# Patient Record
Sex: Male | Born: 2000 | Hispanic: Yes | Marital: Single | Smoking: Never smoker
Health system: Southern US, Community
[De-identification: ages and names within clinical notes are randomized; demographics above are authoritative.]

## PROBLEM LIST (undated history)

## (undated) HISTORY — PX: NECK SURGERY: SHX720

## (undated) HISTORY — PX: FOOT SURGERY: SHX648

---
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
## 2022-04-26 ENCOUNTER — Other Ambulatory Visit: Payer: Self-pay

## 2022-04-26 ENCOUNTER — Emergency Department (HOSPITAL_COMMUNITY)
Admission: EM | Admit: 2022-04-26 | Discharge: 2022-04-26 | Disposition: A | Discharge status: Home / Self Care | Payer: Self-pay | Attending: Emergency Medicine | Admitting: Emergency Medicine

## 2022-04-26 ENCOUNTER — Encounter (HOSPITAL_COMMUNITY): Payer: Self-pay | Admitting: Emergency Medicine

## 2022-04-26 DIAGNOSIS — M791 Myalgia, unspecified site: Secondary | ICD-10-CM | POA: Insufficient documentation

## 2022-04-26 DIAGNOSIS — J069 Acute upper respiratory infection, unspecified: Secondary | ICD-10-CM | POA: Insufficient documentation

## 2022-04-26 DIAGNOSIS — Z1152 Encounter for screening for COVID-19: Secondary | ICD-10-CM | POA: Insufficient documentation

## 2022-04-26 LAB — RESP PANEL BY RT-PCR (FLU A&B, COVID) ARPGX2
Influenza A by PCR: NEGATIVE
Influenza B by PCR: NEGATIVE
SARS Coronavirus 2 by RT PCR: NEGATIVE

## 2022-04-26 NOTE — ED Provider Notes (Signed)
[facility] EMERGENCY DEPARTMENT Provider Note   CSN: [id number] Arrival date & time: 04/26/22  1219     History  Chief Complaint  Patient presents with   Generalized Body Aches    Peter Quinn is a 21 y.o. male.  Patient complains of generalized body aches, fevers, chills, runny nose for the past week.  Patient states that over the past 2 weeks there have been people at work who have gone home with some unknown viral illness.  He states no one is told him it was COVID or the flu.  He denies cough, abdominal pain, nausea, vomiting at this time.  No relevant past medical history on file  HPI     Home Medications Prior to Admission medications   Not on File      Allergies    Patient has no known allergies.    Review of Systems   Review of Systems  Constitutional:  Positive for chills and fever.  HENT:  Positive for rhinorrhea.   Respiratory:  Negative for cough and shortness of breath.   Gastrointestinal:  Negative for abdominal pain, nausea and vomiting.  Musculoskeletal:  Positive for myalgias.    Physical Exam Updated Vital Signs BP (!) 138/92 (BP Location: Right Arm)   Pulse (!) 115   Temp 98.9 F (37.2 C) (Oral)   Resp 20   Ht 5\' 8"  (1.727 m)   Wt 88.9 kg   SpO2 99%   BMI 29.80 kg/m  Physical Exam Vitals and nursing note reviewed.  HENT:     Head: Normocephalic and atraumatic.     Nose: Rhinorrhea present.  Eyes:     Conjunctiva/sclera: Conjunctivae normal.  Cardiovascular:     Rate and Rhythm: Normal rate and regular rhythm.  Pulmonary:     Effort: Pulmonary effort is normal. No respiratory distress.     Breath sounds: Normal breath sounds.  Abdominal:     Palpations: Abdomen is soft.     Tenderness: There is no abdominal tenderness.  Musculoskeletal:        General: No signs of injury.     Cervical back: Normal range of motion.  Skin:    General: Skin is warm and dry.  Neurological:     Mental Status: He is alert.  Psychiatric:         Speech: Speech normal.        Behavior: Behavior normal.     ED Results / Procedures / Treatments   Labs (all labs ordered are listed, but only abnormal results are displayed) Labs Reviewed  RESP PANEL BY RT-PCR (FLU A&B, COVID) [id number]    EKG None  Radiology No results found.  Procedures Procedures    Medications Ordered in ED Medications - No data to display  ED Course/ Medical Decision Making/ A&P                           Medical Decision Making  Patient presents the emergency room complaining of chills, fever, runny nose.  Differential diagnosis includes but is not limited to COVID-19, influenza, other viral illness, and others  There is no relevant documentation available for med review  I ordered and reviewed labs.  Pertinent results include negative COVID-19 and influenza testing.  There is no indication at this time for imaging.  The patient's lungs are clear to auscultation bilaterally  The patient's symptoms are consistent with a viral upper respiratory infection.  Testing was negative for  COVID-19 and influenza today.  There is no indication at this time for further work-up.  The patient does not appear to be toxic.  Plan to discharge patient home.  Patient may use over-the-counter medications for symptom relief as needed.        Final Clinical Impression(s) / ED Diagnoses Final diagnoses:  Upper respiratory tract infection, unspecified type    Rx / DC Orders ED Discharge Orders     None         Pamala Duffel 04/26/22 1400    Terald Sleeper, MD 04/26/22 1655

## 2022-04-26 NOTE — ED Triage Notes (Signed)
Pt via POV c/o generalized body aches, fevers/chills, runny nose, itching for a week. Sick contacts at work within the past 2 weeks.

## 2022-04-26 NOTE — Discharge Instructions (Addendum)
Seen today due to an upper respiratory infection.  Your testing was negative for COVID-19 and influenza.  This is likely some other virus.  Please take over-the-counter medications as needed for symptom relief such as DayQuil or NyQuil.  You may return to work once you are fever free for 24 hours.

## 2022-04-26 NOTE — ED Notes (Signed)
Pa eval prior to nursing assessment

## 2024-04-22 ENCOUNTER — Ambulatory Visit
Admission: EM | Admit: 2024-04-22 | Discharge: 2024-04-22 | Disposition: A | Discharge status: Home / Self Care | Payer: Self-pay | Attending: Family Medicine | Admitting: Family Medicine

## 2024-04-22 DIAGNOSIS — B350 Tinea barbae and tinea capitis: Secondary | ICD-10-CM

## 2024-04-22 DIAGNOSIS — L0100 Impetigo, unspecified: Secondary | ICD-10-CM

## 2024-04-22 MED ORDER — CHLORHEXIDINE GLUCONATE 4 % EX SOLN: Freq: Every day | CUTANEOUS | 0 refills | Status: AC | PRN

## 2024-04-22 MED ORDER — KETOCONAZOLE 2 % EX SHAM: 1.0000 | MEDICATED_SHAMPOO | CUTANEOUS | 0 refills | Status: AC

## 2024-04-22 MED ORDER — KETOCONAZOLE 2 % EX CREA: 1.0000 | TOPICAL_CREAM | Freq: Two times a day (BID) | CUTANEOUS | 0 refills | Status: AC | PRN

## 2024-04-22 NOTE — ED Triage Notes (Signed)
 Pt reports rash on the right side of the head, itchy and painful, started x 2 weeks ago. Have now scabbed over

## 2024-04-22 NOTE — Discharge Instructions (Signed)
 I have sent in some antifungal shampoo and cream to help treat what I suspect to be a scalp fungal infection.  The areas that have been scratched and are open to appear to be becoming irritated and possibly coming infected so I have sent over a strong antiseptic wash to use twice daily as well.  Follow-up for worsening or unresolving symptoms.

## 2024-04-22 NOTE — ED Provider Notes (Signed)
 [facility]    CSN: [id number] Arrival date & time: 04/22/24  1811      History   Chief Complaint No chief complaint on file.   HPI Peter Quinn is a 23 y.o. male.   Patient presenting today with a rash for the past few weeks to the right side of head that is itchy and has now scabbed over.  Denies fever, chills, new soaps or products used, throat itching or swelling, chest tightness.  So far not trying anything over-the-counter for symptoms.    History reviewed. No pertinent past medical history.  Patient Active Problem List   Diagnosis Date Noted   FOREIGN BODY, FOOT 09/21/2009    Past Surgical History:  Procedure Laterality Date   FOOT SURGERY     had glass removed   NECK SURGERY     ?abcess       Home Medications    Prior to Admission medications   Medication Sig Start Date End Date Taking? Authorizing Provider  chlorhexidine (HIBICLENS) 4 % external liquid Apply topically daily as needed. 04/22/24  Yes Stuart Vernell Norris, PA-C  ketoconazole (NIZORAL) 2 % cream Apply 1 Application topically 2 (two) times daily as needed for irritation. 04/22/24  Yes Stuart Vernell Norris, PA-C  ketoconazole (NIZORAL) 2 % shampoo Apply 1 Application topically 2 (two) times a week. 04/23/24  Yes Stuart Vernell Norris, PA-C    Family History History reviewed. No pertinent family history.  Social History Social History   Tobacco Use   Smoking status: Never  Vaping Use   Vaping status: Never Used  Substance Use Topics   Alcohol use: No     Allergies   Patient has no known allergies.   Review of Systems Review of Systems Per HPI  Physical Exam Triage Vital Signs ED Triage Vitals  Encounter Vitals Group     BP 04/22/24 1900 135/88     Girls Systolic BP Percentile --      Girls Diastolic BP Percentile --      Boys Systolic BP Percentile --      Boys Diastolic BP Percentile --      Pulse Rate 04/22/24 1900 74     Resp 04/22/24 1900 18      Temp 04/22/24 1900 98.6 F (37 C)     Temp Source 04/22/24 1900 Oral     SpO2 04/22/24 1900 97 %     Weight --      Height --      Head Circumference --      Peak Flow --      Pain Score 04/22/24 1902 3     Pain Loc --      Pain Education --      Exclude from Growth Chart --    No data found.  Updated Vital Signs BP 135/88 (BP Location: Right Arm)   Pulse 74   Temp 98.6 F (37 C) (Oral)   Resp 18   SpO2 97%   Visual Acuity Right Eye Distance:   Left Eye Distance:   Bilateral Distance:    Right Eye Near:   Left Eye Near:    Bilateral Near:     Physical Exam Vitals and nursing note reviewed.  Constitutional:      Appearance: Normal appearance.  HENT:     Head: Atraumatic.  Eyes:     Extraocular Movements: Extraocular movements intact.     Conjunctiva/sclera: Conjunctivae normal.  Cardiovascular:     Rate and  Rhythm: Normal rate.  Pulmonary:     Effort: Pulmonary effort is normal.  Musculoskeletal:        General: Normal range of motion.     Cervical back: Normal range of motion and neck supple.  Skin:    General: Skin is warm and dry.     Findings: Rash present.     Comments: Erythematous papular lesions, some scabbed and crusted, some dry and flaky to the right side of scalp and neck  Neurological:     Mental Status: He is oriented to person, place, and time.  Psychiatric:        Mood and Affect: Mood normal.        Thought Content: Thought content normal.        Judgment: Judgment normal.      UC Treatments / Results  Labs (all labs ordered are listed, but only abnormal results are displayed) Labs Reviewed - No data to display  EKG   Radiology No results found.  Procedures Procedures (including critical care time)  Medications Ordered in UC Medications - No data to display  Initial Impression / Assessment and Plan / UC Course  I have reviewed the triage vital signs and the nursing notes.  Pertinent labs & imaging results that were  available during my care of the patient were reviewed by me and considered in my medical decision making (see chart for details).     Suspect tinea capitis, treat with ketoconazole cream and shampoo.  Crusted areas do appear to be developing some impetigo still will treat with Hibiclens solution several times daily for this and follow-up for worsening or unresolving symptoms.  Final Clinical Impressions(s) / UC Diagnoses   Final diagnoses:  Tinea capitis  Impetigo     Discharge Instructions      I have sent in some antifungal shampoo and cream to help treat what I suspect to be a scalp fungal infection.  The areas that have been scratched and are open to appear to be becoming irritated and possibly coming infected so I have sent over a strong antiseptic wash to use twice daily as well.  Follow-up for worsening or unresolving symptoms.   ED Prescriptions     Medication Sig Dispense Auth. Provider   chlorhexidine (HIBICLENS) 4 % external liquid Apply topically daily as needed. 236 mL Stuart Vernell Norris, PA-C   ketoconazole (NIZORAL) 2 % shampoo Apply 1 Application topically 2 (two) times a week. 120 mL Stuart Vernell Norris, PA-C   ketoconazole (NIZORAL) 2 % cream Apply 1 Application topically 2 (two) times daily as needed for irritation. 80 g Stuart Vernell Norris, [place]      PDMP not reviewed this encounter.   Stuart Vernell Norris, [place] 04/22/24 1956
